# Patient Record
Sex: Female | Born: 1935 | Race: White | Hispanic: No | Marital: Married | Smoking: Never smoker
Health system: Southern US, Community
[De-identification: ages and names within clinical notes are randomized; demographics above are authoritative.]

## PROBLEM LIST (undated history)

## (undated) DIAGNOSIS — E079 Disorder of thyroid, unspecified: Secondary | ICD-10-CM

## (undated) DIAGNOSIS — M1990 Unspecified osteoarthritis, unspecified site: Secondary | ICD-10-CM

## (undated) HISTORY — DX: Disorder of thyroid, unspecified: E07.9

## (undated) HISTORY — DX: Unspecified osteoarthritis, unspecified site: M19.90

---

## 2009-09-26 ENCOUNTER — Inpatient Hospital Stay (HOSPITAL_COMMUNITY): Admission: RE | Admit: 2009-09-26 | Discharge: 2009-09-28 | Payer: Self-pay | Admitting: Orthopedic Surgery

## 2010-11-20 LAB — COMPREHENSIVE METABOLIC PANEL
AST: 23 U/L (ref 0–37)
Albumin: 4.4 g/dL (ref 3.5–5.2)
Calcium: 10.5 mg/dL (ref 8.4–10.5)
Chloride: 102 mEq/L (ref 96–112)
Creatinine, Ser: 1.22 mg/dL — ABNORMAL HIGH (ref 0.4–1.2)
Sodium: 138 mEq/L (ref 135–145)
Total Bilirubin: 1.2 mg/dL (ref 0.3–1.2)
Total Protein: 7.3 g/dL (ref 6.0–8.3)

## 2010-11-20 LAB — BASIC METABOLIC PANEL
BUN: 13 mg/dL (ref 6–23)
CO2: 26 mEq/L (ref 19–32)
Calcium: 8.4 mg/dL (ref 8.4–10.5)
Calcium: 8.6 mg/dL (ref 8.4–10.5)
Creatinine, Ser: 0.84 mg/dL (ref 0.4–1.2)
Creatinine, Ser: 0.94 mg/dL (ref 0.4–1.2)
GFR calc Af Amer: >60 mL/min (ref >60)
GFR calc non Af Amer: 58 mL/min — ABNORMAL LOW (ref >60)

## 2010-11-20 LAB — CROSSMATCH
ABO/RH(D): O POS
Antibody Screen: NEGATIVE

## 2010-11-20 LAB — CBC
Hemoglobin: 10.5 g/dL — ABNORMAL LOW (ref 12.0–15.0)
Hemoglobin: 8.9 g/dL — ABNORMAL LOW (ref 12.0–15.0)
MCHC: 34 g/dL (ref 30.0–36.0)
MCHC: 34.4 g/dL (ref 30.0–36.0)
MCV: 96.8 fL (ref 78.0–100.0)
Platelets: 260 10*3/uL (ref 150–400)
RBC: 2.7 MIL/uL — ABNORMAL LOW (ref 3.87–5.11)
RBC: 3.18 MIL/uL — ABNORMAL LOW (ref 3.87–5.11)
WBC: 6.3 10*3/uL (ref 4.0–10.5)

## 2010-11-20 LAB — DIFFERENTIAL
Basophils Relative: 0 % (ref 0–1)
Monocytes Relative: 10 % (ref 3–12)
Neutro Abs: 3.2 10*3/uL (ref 1.7–7.7)
Neutrophils Relative %: 50 % (ref 43–77)

## 2010-11-20 LAB — URINALYSIS, ROUTINE W REFLEX MICROSCOPIC
Bilirubin Urine: NEGATIVE
Nitrite: NEGATIVE
Specific Gravity, Urine: 1.009 (ref 1.005–1.030)
Urobilinogen, UA: 0.2 mg/dL (ref 0.0–1.0)
pH: 6.5 (ref 5.0–8.0)

## 2010-11-20 LAB — URINE CULTURE

## 2010-11-20 LAB — PROTIME-INR: Prothrombin Time: 12.8 seconds (ref 11.6–15.2)

## 2014-01-13 ENCOUNTER — Ambulatory Visit (INDEPENDENT_AMBULATORY_CARE_PROVIDER_SITE_OTHER): Payer: Medicare HMO

## 2014-01-13 VITALS — BP 112/87 | HR 78 | Resp 18

## 2014-01-13 DIAGNOSIS — M2040 Other hammer toe(s) (acquired), unspecified foot: Secondary | ICD-10-CM

## 2014-01-13 DIAGNOSIS — Q828 Other specified congenital malformations of skin: Secondary | ICD-10-CM

## 2014-01-13 DIAGNOSIS — M79609 Pain in unspecified limb: Secondary | ICD-10-CM

## 2014-01-13 DIAGNOSIS — B351 Tinea unguium: Secondary | ICD-10-CM

## 2014-01-13 DIAGNOSIS — M2010 Hallux valgus (acquired), unspecified foot: Secondary | ICD-10-CM

## 2014-01-13 NOTE — Patient Instructions (Signed)

## 2014-01-13 NOTE — Progress Notes (Signed)
   Subjective:    Patient ID: Tammy Little, female    DOB: [birth year], 80 y.o.   MRN: [id number]  HPI I need my toenails trimmed up and check my feet and my ankles do hurt some and my feet do get cold    Review of Systems  Musculoskeletal:       Joint pain  Hematological: Bruises/bleeds easily.  All other systems reviewed and are negative.      Objective:   Physical Exam 80 year old female well-developed well-nourished oriented x3 vital signs stable. Patient presents at this time for debridement of nails which are somewhat tender yellow discolored has been seen in the past there's been some time. Morphine objective findings as follows patient does have pedal pulses are palpable DP +2/4 bilateral PT plus one over 4 capillary refill timed 3-4 seconds all digits there is mild varicosities both lower extremities and ankles with +2 edema both lower legs and ankles patient does have some degenerative changes with notable bunion deformity left more so than right hammertoe deformities 2 through 5 left 4 and 5 right with adductovarus rotation is 4 and 5 right patient does have dorsal displacement second toe left and more significant bunion deformity on left foot. There is some reduced range of motion and stiffness the joints which are semirigid in nature. Orthopedic biomechanical exam otherwise no x-rays taken rectus rear foot mid foot is noted otherwise normal digital contractures bunions bilateral dermatologically skin color pigment normal hair growth absent nails yellowed and brittle crumbly friable discolored 1 through 5 bilateral tender both on palpation and with enclosed shoe wear. There is also pinch callus in the hallux MTP joints bilateral and IP joint left as well. There is no open wounds or ulcers or signs of infection noted at this time to       Assessment & Plan:  Assessment patient does have mild venous insufficiency with the edema no open wounds or ulcers pedal pulses palpable nails  thick brittle yellowed crumbly friable painful and tender with enclosed shoe wear and ambulation and on palpation mycotic nails noted one through 5 bilateral these are debrided patient is advised topical antifungal shoe to she's using over-the-counter topicals although has not been consistent and there use of by she sees us twice daily for 12 months duration. At this time also suggested followup in 3 months for continued palliative care multiple keratoses sub-1 bilateral debrided and then the hallux IP joint also debridement at this time followup as needed in the future for palliative care also recommended accommodative shoes at all times next  Alvan Dameichard Jamaree Hosier DPM

## 2018-06-30 ENCOUNTER — Ambulatory Visit: Payer: Medicare Other | Admitting: Podiatry

## 2018-06-30 ENCOUNTER — Encounter: Payer: Self-pay | Admitting: Podiatry

## 2018-06-30 VITALS — BP 164/87 | HR 54 | Wt 180.0 lb

## 2018-06-30 DIAGNOSIS — M79609 Pain in unspecified limb: Principal | ICD-10-CM

## 2018-06-30 DIAGNOSIS — B351 Tinea unguium: Secondary | ICD-10-CM

## 2018-06-30 DIAGNOSIS — M79676 Pain in unspecified toe(s): Secondary | ICD-10-CM | POA: Diagnosis not present

## 2018-06-30 NOTE — Progress Notes (Signed)
  Subjective:  Patient ID: Tammy Little, female    DOB: [birth year],  MRN: [id number]  Chief Complaint  Patient presents with  . Nail Problem    Long thick uncomfortable nail that I can't trim     84 y.o. female presents with the above complaint.  Reports painfully elongated nails to both feet.  Review of Systems: Negative except as noted in the HPI. Denies N/V/F/Ch.  Past Medical History:  Diagnosis Date  . Arthritis   . Thyroid disease    No current outpatient medications on file.  Social History   Tobacco Use  Smoking Status Never Smoker  Smokeless Tobacco Never Used    No Known Allergies Objective:   Vitals:   06/30/18 1107  BP: (!) 164/87  Pulse: (!) 54   There is no height or weight on file to calculate BMI. Constitutional Well developed. Well nourished.  Vascular Dorsalis pedis pulses palpable bilaterally. Posterior tibial pulses palpable bilaterally. Capillary refill normal to all digits.  No cyanosis or clubbing noted. Pedal hair growth normal.  Neurologic Normal speech. Oriented to person, place, and time. Epicritic sensation to light touch grossly present bilaterally.  Dermatologic Nails elongated dystrophic pain to palpation No open wounds. No skin lesions.  Orthopedic: Normal joint ROM without pain or crepitus bilaterally. No visible deformities. No bony tenderness.   Radiographs: None Assessment:   1. Pain due to onychomycosis of nail    Plan:  Patient was evaluated and treated and all questions answered.  Onychomycosis with pain -Nails palliatively debridement as below -Educated on self-care -Does not meet criteria for routine debridement.  Procedure: Nail Debridement Rationale: Pain Type of Debridement: manual, sharp debridement. Instrumentation: Nail nipper, rotary burr. Number of Nails: 10  No follow-ups on file.

## 2018-11-08 ENCOUNTER — Emergency Department (HOSPITAL_COMMUNITY): Payer: Medicare HMO

## 2018-11-08 ENCOUNTER — Emergency Department (HOSPITAL_COMMUNITY)
Admission: EM | Admit: 2018-11-08 | Discharge: 2018-11-08 | Disposition: A | Discharge status: Home / Self Care | Payer: Medicare HMO | Attending: Emergency Medicine | Admitting: Emergency Medicine

## 2018-11-08 ENCOUNTER — Other Ambulatory Visit: Payer: Self-pay

## 2018-11-08 DIAGNOSIS — R51 Headache: Secondary | ICD-10-CM | POA: Diagnosis not present

## 2018-11-08 DIAGNOSIS — R1010 Upper abdominal pain, unspecified: Secondary | ICD-10-CM | POA: Insufficient documentation

## 2018-11-08 DIAGNOSIS — Z79899 Other long term (current) drug therapy: Secondary | ICD-10-CM | POA: Diagnosis not present

## 2018-11-08 DIAGNOSIS — R519 Headache, unspecified: Secondary | ICD-10-CM

## 2018-11-08 DIAGNOSIS — Z7901 Long term (current) use of anticoagulants: Secondary | ICD-10-CM | POA: Diagnosis not present

## 2018-11-08 DIAGNOSIS — R109 Unspecified abdominal pain: Secondary | ICD-10-CM

## 2018-11-08 DIAGNOSIS — I4891 Unspecified atrial fibrillation: Secondary | ICD-10-CM

## 2018-11-08 LAB — COMPREHENSIVE METABOLIC PANEL
ALK PHOS: 127 U/L — AB (ref 38–126)
ALT: 19 U/L (ref 0–44)
AST: 16 U/L (ref 15–41)
Albumin: 2.9 g/dL — ABNORMAL LOW (ref 3.5–5.0)
Anion gap: 12 (ref 5–15)
BILIRUBIN TOTAL: 0.8 mg/dL (ref 0.3–1.2)
BUN: 11 mg/dL (ref 8–23)
CALCIUM: 9 mg/dL (ref 8.9–10.3)
CO2: 26 mmol/L (ref 22–32)
CREATININE: 0.89 mg/dL (ref 0.44–1.00)
Chloride: 92 mmol/L — ABNORMAL LOW (ref 98–111)
GFR calc Af Amer: >60 mL/min (ref >60)
Glucose, Bld: 132 mg/dL — ABNORMAL HIGH (ref 70–99)
Potassium: 3.6 mmol/L (ref 3.5–5.1)
Sodium: 130 mmol/L — ABNORMAL LOW (ref 135–145)
TOTAL PROTEIN: 6.9 g/dL (ref 6.5–8.1)

## 2018-11-08 LAB — CBC WITH DIFFERENTIAL/PLATELET
ABS IMMATURE GRANULOCYTES: 0.14 10*3/uL — AB (ref 0.00–0.07)
Basophils Absolute: 0.1 10*3/uL (ref 0.0–0.1)
Basophils Relative: 0 %
EOS ABS: 0.1 10*3/uL (ref 0.0–0.5)
EOS PCT: 1 %
HCT: 37.4 % (ref 36.0–46.0)
Hemoglobin: 12.2 g/dL (ref 12.0–15.0)
Immature Granulocytes: 1 %
Lymphocytes Relative: 7 %
Lymphs Abs: 0.9 10*3/uL (ref 0.7–4.0)
MCH: 29.3 pg (ref 26.0–34.0)
MCHC: 32.6 g/dL (ref 30.0–36.0)
MCV: 89.7 fL (ref 80.0–100.0)
MONO ABS: 1.1 10*3/uL — AB (ref 0.1–1.0)
MONOS PCT: 8 %
NEUTROS ABS: 11.5 10*3/uL — AB (ref 1.7–7.7)
Neutrophils Relative %: 83 %
Platelets: 507 10*3/uL — ABNORMAL HIGH (ref 150–400)
RBC: 4.17 MIL/uL (ref 3.87–5.11)
RDW: 12.6 % (ref 11.5–15.5)
WBC: 13.8 10*3/uL — ABNORMAL HIGH (ref 4.0–10.5)
nRBC: 0 % (ref 0.0–0.2)

## 2018-11-08 LAB — URINALYSIS, ROUTINE W REFLEX MICROSCOPIC
BILIRUBIN URINE: NEGATIVE
Bacteria, UA: NONE SEEN
Glucose, UA: NEGATIVE mg/dL
KETONES UR: 5 mg/dL — AB
Nitrite: NEGATIVE
PH: 7 (ref 5.0–8.0)
Protein, ur: NEGATIVE mg/dL
Specific Gravity, Urine: 1.006 (ref 1.005–1.030)

## 2018-11-08 LAB — PROTIME-INR
INR: 1.1 (ref 0.8–1.2)
Prothrombin Time: 14.4 seconds (ref 11.4–15.2)

## 2018-11-08 LAB — TROPONIN I: Troponin I: <0.03 ng/mL (ref <0.03)

## 2018-11-08 LAB — LIPASE, BLOOD: LIPASE: 35 U/L (ref 11–51)

## 2018-11-08 MED ORDER — OXYCODONE HCL 10 MG PO TABS
10.00 | ORAL_TABLET | ORAL | Status: DC
Start: ? — End: 2018-11-08

## 2018-11-08 MED ORDER — GABAPENTIN 100 MG PO CAPS
100.00 | ORAL_CAPSULE | ORAL | Status: DC
Start: 2018-11-06 — End: 2018-11-08

## 2018-11-08 MED ORDER — AMLODIPINE BESYLATE 5 MG PO TABS
5.00 | ORAL_TABLET | ORAL | Status: DC
Start: 2018-11-07 — End: 2018-11-08

## 2018-11-08 MED ORDER — PANTOPRAZOLE SODIUM 20 MG PO TBEC: 20.0000 mg | DELAYED_RELEASE_TABLET | Freq: Every day | ORAL | 0 refills | Status: DC

## 2018-11-08 MED ORDER — GENERIC EXTERNAL MEDICATION
12.50 | Status: DC
Start: ? — End: 2018-11-08

## 2018-11-08 MED ORDER — ONDANSETRON HCL 4 MG PO TABS: 4.0000 mg | ORAL_TABLET | Freq: Two times a day (BID) | ORAL | 0 refills | Status: DC | PRN

## 2018-11-08 MED ORDER — SODIUM CHLORIDE 0.9 % IV BOLUS
500.0000 mL | Freq: Once | INTRAVENOUS | Status: AC
  Administered 2018-11-08: 500 mL via INTRAVENOUS

## 2018-11-08 MED ORDER — ONDANSETRON 4 MG PO TBDP
4.00 | ORAL_TABLET | ORAL | Status: DC
Start: ? — End: 2018-11-08

## 2018-11-08 MED ORDER — LIDOCAINE 5 % EX PTCH
1.00 | MEDICATED_PATCH | CUTANEOUS | Status: DC
Start: 2018-11-07 — End: 2018-11-08

## 2018-11-08 MED ORDER — MORPHINE SULFATE (PF) 4 MG/ML IV SOLN
4.0000 mg | Freq: Once | INTRAVENOUS | Status: AC
  Administered 2018-11-08: 4 mg via INTRAVENOUS
  Filled 2018-11-08: qty 1

## 2018-11-08 MED ORDER — DILTIAZEM HCL 25 MG/5ML IV SOLN
10.0000 mg | Freq: Once | INTRAVENOUS | Status: AC
  Administered 2018-11-08: 10 mg via INTRAVENOUS
  Filled 2018-11-08: qty 5

## 2018-11-08 MED ORDER — LISINOPRIL 40 MG PO TABS
40.00 | ORAL_TABLET | ORAL | Status: DC
Start: 2018-11-07 — End: 2018-11-08

## 2018-11-08 MED ORDER — ONDANSETRON HCL 4 MG/2ML IJ SOLN
4.0000 mg | Freq: Once | INTRAMUSCULAR | Status: AC
  Administered 2018-11-08: 4 mg via INTRAVENOUS
  Filled 2018-11-08: qty 2

## 2018-11-08 MED ORDER — PRAVASTATIN SODIUM 40 MG PO TABS
40.00 | ORAL_TABLET | ORAL | Status: DC
Start: 2018-11-07 — End: 2018-11-08

## 2018-11-08 MED ORDER — APIXABAN 5 MG PO TABS
5.00 | ORAL_TABLET | ORAL | Status: DC
Start: 2018-11-06 — End: 2018-11-08

## 2018-11-08 MED ORDER — SENNOSIDES 8.6 MG PO TABS
2.00 | ORAL_TABLET | ORAL | Status: DC
Start: ? — End: 2018-11-08

## 2018-11-08 MED ORDER — METOPROLOL SUCCINATE ER 50 MG PO TB24
50.00 | ORAL_TABLET | ORAL | Status: DC
Start: 2018-11-07 — End: 2018-11-08

## 2018-11-08 MED ORDER — ACETAMINOPHEN 325 MG PO TABS
650.00 | ORAL_TABLET | ORAL | Status: DC
Start: ? — End: 2018-11-08

## 2018-11-08 MED ORDER — SODIUM CHLORIDE 0.9 % IV SOLN
INTRAVENOUS | Status: DC
  Administered 2018-11-08: 14:00:00 via INTRAVENOUS

## 2018-11-08 MED ORDER — POLYETHYLENE GLYCOL 3350 17 G PO PACK
17.00 | PACK | ORAL | Status: DC
Start: ? — End: 2018-11-08

## 2018-11-08 NOTE — ED Notes (Signed)
Pt wants to try to use the bed pan first before we perform the in and out

## 2018-11-08 NOTE — Discharge Instructions (Signed)
Follow-up with your primary care doctor, consider seeing a GI doctor for further evaluation

## 2018-11-08 NOTE — ED Triage Notes (Signed)
Per Pt,  She comes from home complaining of a headache x2 weeks.  [facility] and states she didn't find was was going on even though she was there a week.  Pt AOx4 NAD noted at this time.

## 2018-11-08 NOTE — ED Notes (Signed)
EDP IN WITH PT.

## 2018-11-08 NOTE — ED Provider Notes (Signed)
[facility] EMERGENCY DEPARTMENT Provider Note   CSN: [id number] Arrival date & time: 11/08/18  1051    History   Chief Complaint Chief Complaint  Patient presents with  . Abdominal Pain  . Headache    HPI Tammy Little is a 85 y.o. female.     HPI Presents to the emergency room for evaluation of several symptoms but primarily headache as well as abdominal pain.  Patient states she started feeling sick couple of weeks ago.  She saw her primary care doctor and was diagnosed with possible urinary tract infection.  This was back the end of February.  Patient symptoms started getting worse so she was seen at a hospital in [place].  Family member states she was admitted to the hospital.  Was having trouble with abdominal pain vomiting as well as headaches.  Was just recently discharged.  Patient returns to the ED today because she continues to have the same symptoms and she started vomiting.  She continues to have a headache.  She has upper abdominal discomfort and has had nausea vomiting.  No known fevers.  No known focal weakness or numbness.  Patient does not have history of prior headaches until this spell. Past Medical History:  Diagnosis Date  . Arthritis   . Thyroid disease       No past surgical history on file.   OB History   No obstetric history on file.      Home Medications    Prior to Admission medications   Medication Sig Start Date End Date Taking? Authorizing Provider  acetaminophen (TYLENOL) 325 MG tablet Take 325 mg by mouth daily. 11/06/18  Yes [provider]  amoxicillin (AMOXIL) 875 MG tablet Take 875 mg by mouth 2 (two) times daily. 10/28/18  Yes [provider]  ELIQUIS 5 MG TABS tablet Take 5 mg by mouth 2 (two) times daily. 10/22/18  Yes [provider]  folic acid (FOLVITE) 800 MCG tablet Take 800 mcg by mouth daily.   Yes [provider]  gabapentin (NEURONTIN) 100 MG capsule Take 100 mg by  mouth 3 (three) times daily.   Yes [provider]  levothyroxine (SYNTHROID, LEVOTHROID) 25 MCG tablet Take 25 mcg by mouth every morning. 09/18/18  Yes [provider]  lisinopril (PRINIVIL,ZESTRIL) 40 MG tablet Take 30 mg by mouth daily.   Yes [provider]  pravastatin (PRAVACHOL) 40 MG tablet Take 40 mg by mouth daily. 10/04/18  Yes [provider]  ondansetron (ZOFRAN) 4 MG tablet Take 1 tablet (4 mg total) by mouth 2 (two) times daily as needed for nausea or vomiting. 11/08/18   Linwood Dibbles, MD  pantoprazole (PROTONIX) 20 MG tablet Take 1 tablet (20 mg total) by mouth daily. 11/08/18   Linwood Dibbles, MD    Family History No family history on file.  Social History Social History   Tobacco Use  . Smoking status: Never Smoker  . Smokeless tobacco: Never Used  Substance Use Topics  . Alcohol use: No  . Drug use: No     Allergies   Patient has no known allergies.   Review of Systems Review of Systems  All other systems reviewed and are negative.    Physical Exam Updated Vital Signs BP (!) 142/90   Pulse (!) 57   Temp 98 F (36.7 C) (Oral)   Resp 16   Ht 1.626 m ( )   Wt 70.3 kg   SpO2 100%  BMI 26.61 kg/m   Physical Exam Vitals signs and nursing note reviewed.  Constitutional:      Appearance: She is ill-appearing.  HENT:     Head: Normocephalic and atraumatic.     Right Ear: External ear normal.     Left Ear: External ear normal.  Eyes:     General: No scleral icterus.       Right eye: No discharge.        Left eye: No discharge.     Conjunctiva/sclera: Conjunctivae normal.  Neck:     Musculoskeletal: Neck supple.     Trachea: No tracheal deviation.  Cardiovascular:     Rate and Rhythm: Tachycardia present. Rhythm irregularly irregular.  Pulmonary:     Effort: Pulmonary effort is normal. No respiratory distress.     Breath sounds: Normal breath sounds. No stridor. No wheezing or rales.  Abdominal:     General:  Bowel sounds are normal. There is no distension.     Palpations: Abdomen is soft.     Tenderness: There is no abdominal tenderness. There is no guarding or rebound.  Musculoskeletal:        General: No tenderness.  Skin:    General: Skin is warm and dry.     Findings: No rash.  Neurological:     Mental Status: She is alert.     Cranial Nerves: No cranial nerve deficit (no facial droop, extraocular movements intact, no slurred speech).     Sensory: No sensory deficit.     Motor: No abnormal muscle tone or seizure activity.     Coordination: Coordination normal.      ED Treatments / Results  Labs (all labs ordered are listed, but only abnormal results are displayed) Labs Reviewed  COMPREHENSIVE METABOLIC PANEL - Abnormal; Notable for the following components:      Result Value   Sodium 130 (*)    Chloride 92 (*)    Glucose, Bld 132 (*)    Albumin 2.9 (*)    Alkaline Phosphatase 127 (*)    All other components within normal limits  CBC WITH DIFFERENTIAL/PLATELET - Abnormal; Notable for the following components:   WBC 13.8 (*)    Platelets 507 (*)    Neutro Abs 11.5 (*)    Monocytes Absolute 1.1 (*)    Abs Immature Granulocytes 0.14 (*)    All other components within normal limits  URINALYSIS, ROUTINE W REFLEX MICROSCOPIC - Abnormal; Notable for the following components:   Hgb urine dipstick SMALL (*)    Ketones, ur 5 (*)    Leukocytes,Ua TRACE (*)    All other components within normal limits  URINE CULTURE  LIPASE, BLOOD  PROTIME-INR  TROPONIN I    EKG EKG Interpretation  Date/Time:  Saturday November 08 2018 11:05:27 EST Ventricular Rate:  143 PR Interval:    QRS Duration: 133 QT Interval:  342 QTC Calculation: 528 R Axis:   91 Text Interpretation:  probable  Atrial flutter with 2 to 1 block RBBB and LPFB Baseline wander in lead(s) V6 Confirmed by Linwood Dibbles [phone]) on 11/08/2018 11:44:07 AM   Radiology Ct Head Wo Contrast  Result Date: 11/08/2018 CLINICAL  DATA:  Headache EXAM: CT HEAD WITHOUT CONTRAST TECHNIQUE: Contiguous axial images were obtained from the base of the skull through the vertex without intravenous contrast. COMPARISON:  None. FINDINGS: Brain: No evidence of acute infarction, hemorrhage, hydrocephalus, extra-axial collection or mass lesion/mass effect. Periventricular white matter hypodensity. Vascular: No hyperdense vessel or unexpected  calcification. Skull: Normal. Negative for fracture or focal lesion. Sinuses/Orbits: No acute finding. Other: None. IMPRESSION: No acute intracranial pathology. Small-vessel white matter disease. No non-contrast CT findings to explain headache. Electronically Signed   By: Lauralyn Primes M.D.   On: 11/08/2018 13:33   Dg Abd Acute W/chest  Result Date: 11/08/2018 CLINICAL DATA:  Upper mid abdominal pain, nausea and vomiting for the past 2 weeks. EXAM: DG ABDOMEN ACUTE W/ 1V CHEST COMPARISON:  Abdomen and pelvis CT dated 11/21/2015 and chest radiographs dated 09/23/2009. FINDINGS: Decreased depth of inspiration with interval mild enlargement of the cardiac silhouette. The lungs are clear with mild chronic prominence of the interstitial markings. Mild bilateral shoulder degenerative changes and mild dextroconvex thoracic scoliosis. Paucity of intestinal gas with no free peritoneal air. Marked lumbar spine degenerative changes and mild scoliosis. IMPRESSION: 1. No acute abnormality. 2. Mild cardiomegaly and mild chronic interstitial lung disease. Electronically Signed   By: Beckie Salts M.D.   On: 11/08/2018 12:28    Procedures Procedures (including critical care time)  Medications Ordered in ED Medications  sodium chloride 0.9 % bolus 500 mL (0 mLs Intravenous Stopped 11/08/18 1356)    And  0.9 %  sodium chloride infusion ( Intravenous New Bag/Given 11/08/18 1416)  ondansetron (ZOFRAN) injection 4 mg (4 mg Intravenous Given 11/08/18 1136)  morphine 4 MG/ML injection 4 mg (4 mg Intravenous Given 11/08/18 1136)    diltiazem (CARDIZEM) injection 10 mg (10 mg Intravenous Given 11/08/18 1134)     Initial Impression / Assessment and Plan / ED Course  I have reviewed the triage vital signs and the nursing notes.  Pertinent labs & imaging results that were available during my care of the patient were reviewed by me and considered in my medical decision making (see chart for details).  Prior records were reviewed.  Discharge summary from [facility] is available in care everywhere.  Patient was admitted on February 27 and discharged on March 5.  Patient was treated for hyponatremia.  Discharge sodium was at 127.  Patient was also evaluated for abdominal pain and vomiting.  Her flu test was negative.  She had a CT abdomen pelvis that showed evidence of enteritis.  She also complained of headache and neck pain.  Patient had a CT of her head and a CT of her C-spine without acute findings.  She was discharged with Percocet gabapentin and Lidoderm patch.  Patient was noted to be in atrial flutter and she was continued on metoprolol and Eliquis.  Patient also had her levothyroxine held because her TSH was 0.08 Clinical Course as of Nov 07 1557  Sat Nov 08, 2018  1555 Laboratory tests are reassuring.  Mild decrease in sodium at 130.  Urinalysis not definitive UTI.  Patient does have a persistent leukocytosis but this is not significantly changed from recent.   [JK]  1557 CT and plain films without acute findings. Patient heart rate has improved with medications   [JK]    Clinical Course User Index [JK] Linwood Dibbles, MD    Patient presented to the emergency room for evaluation of persistent issues with abdominal pain as well as headache.  Patient had an extensive work-up during her hospitalization.  She was just discharged 2 days ago.  Her findings today do not show any significant abnormalities that would warrant rehospitalization.  Unclear what specifically causing some this abdominal pain for her but she did not have  any vomiting in the ED and is tolerating p.o. fluids  and food.  I think the patient can continue on antiemetics.  She Artie has pain medications.  I will have her try taking an antacid.  I recommend outpatient GI follow-up.  She does have known atrial fibrillation and her rate is controlled at this time.  Final Clinical Impressions(s) / ED Diagnoses   Final diagnoses:  Intractable headache, unspecified chronicity pattern, unspecified headache type  Abdominal pain, unspecified abdominal location  Atrial fibrillation, unspecified type [facility])    ED Discharge Orders         Ordered    pantoprazole (PROTONIX) 20 MG tablet  Daily     11/08/18 1549    ondansetron (ZOFRAN) 4 MG tablet  2 times daily PRN     11/08/18 1549           Linwood Dibbles, MD 11/08/18 1559

## 2018-11-10 LAB — URINE CULTURE: Culture: NO GROWTH

## 2019-10-20 IMAGING — CT CT HEAD W/O CM
4 series · 16 of 47 positions shown, 18 images · non-contrast
Comparison: None.

CLINICAL DATA: Headache

EXAM:
CT HEAD WITHOUT CONTRAST
TECHNIQUE: Contiguous axial images were obtained from the base of the skull
through the vertex without intravenous contrast.

[Series 3: head wo · axial · 0.40mm/px · z∈[-158,-52]mm · 7 of 29 slices shown, 9 images]
[im 4/29  brain]
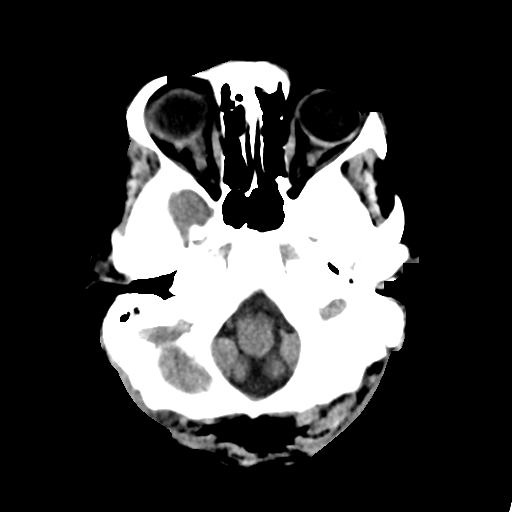
[im 4/29  bone]
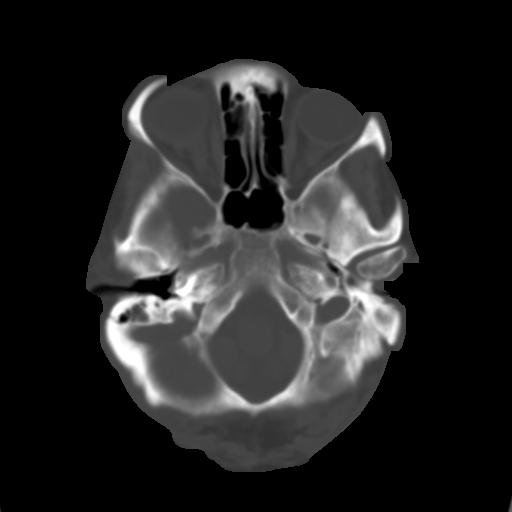
[im 8/29  brain]
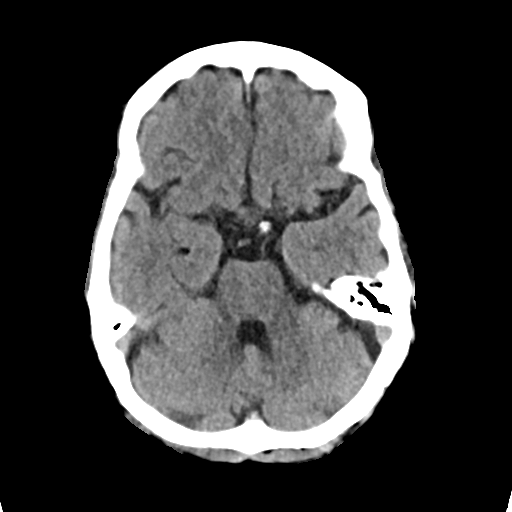
[im 11/29  brain]
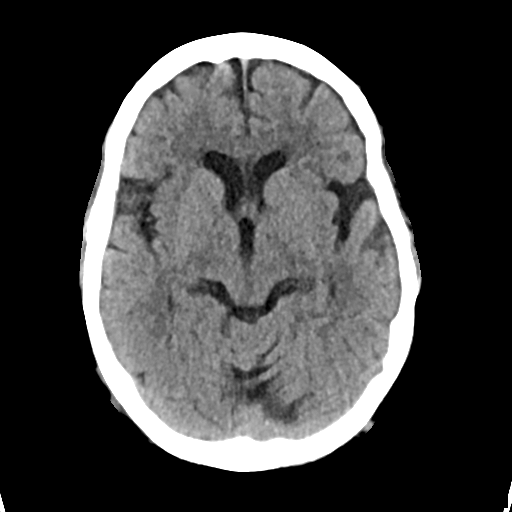
[im 15/29  brain]
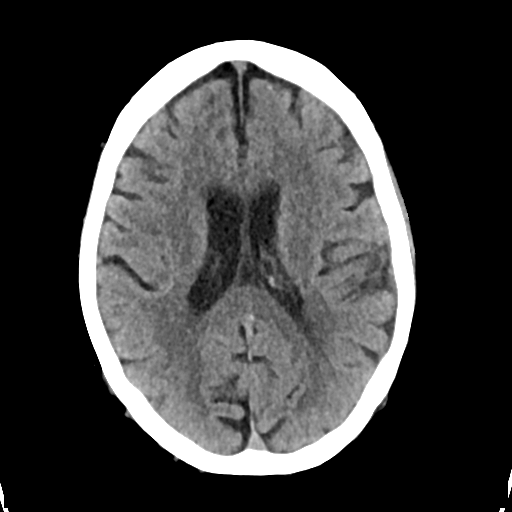
[im 18/29  brain]
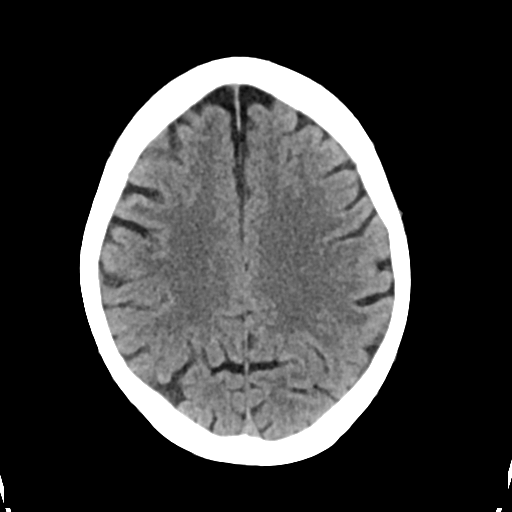
[im 18/29  bone]
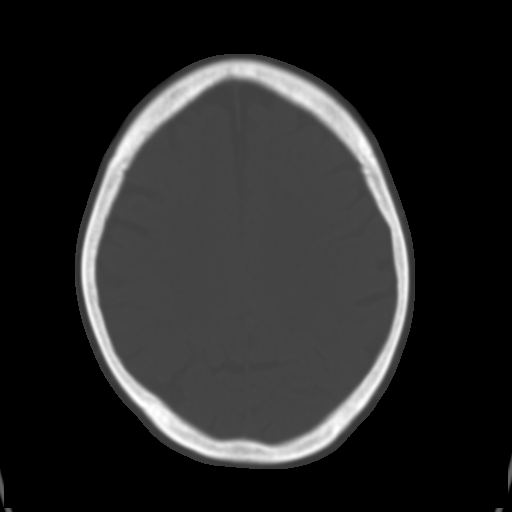
[im 22/29  brain]
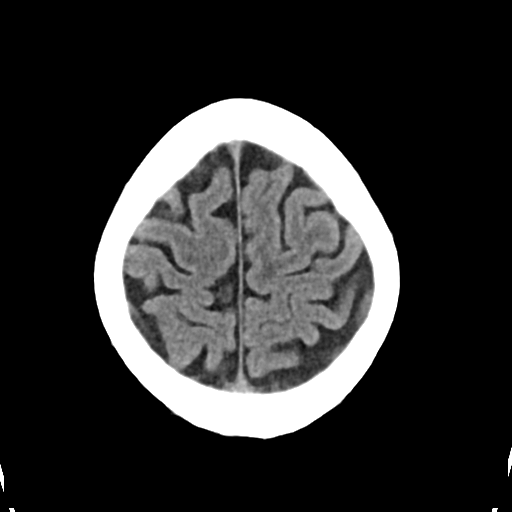
[im 25/29  brain]
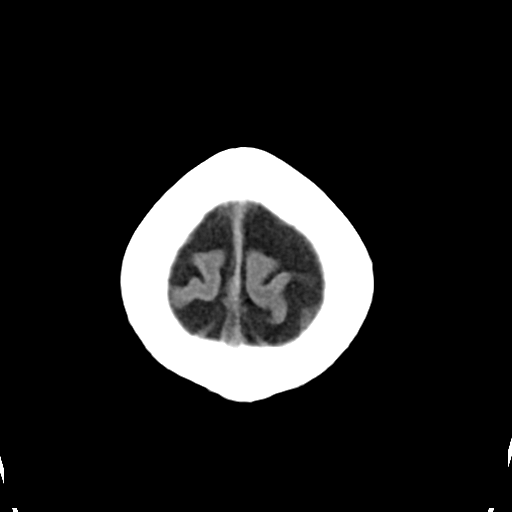

[Series 4: head bone · axial · 0.40mm/px · z∈[-158,-130]mm · 3 of 73 slices shown]
[im 8/73  bone]
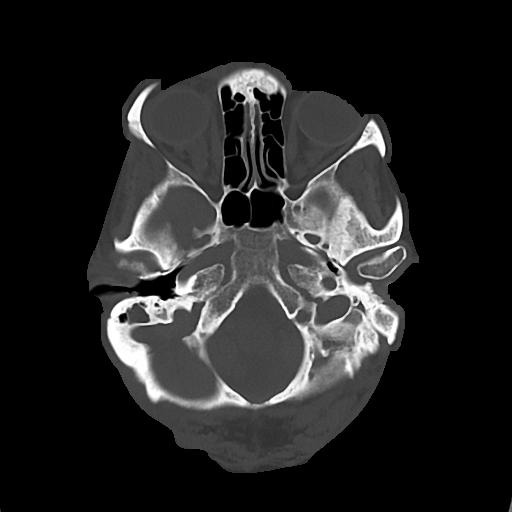
[im 15/73  bone]
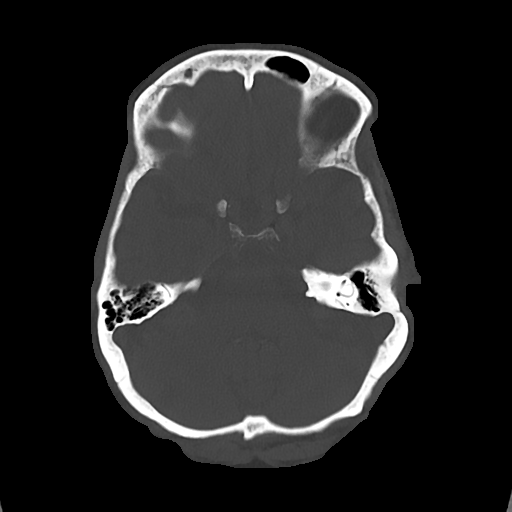
[im 22/73  bone]
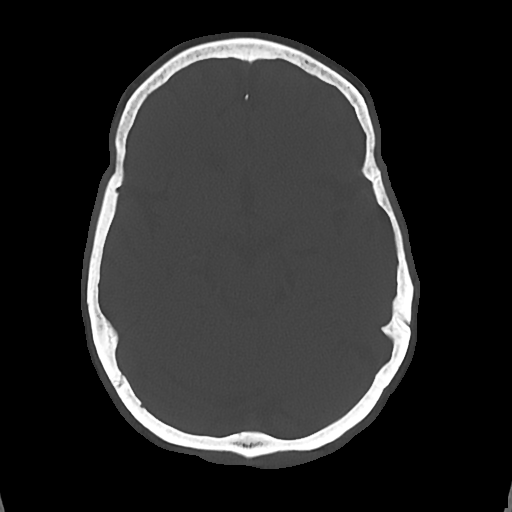

[Series 5: cor soft · coronal · 0.33mm/px · 3 of 60 slices shown]
[im 20/60  brain]
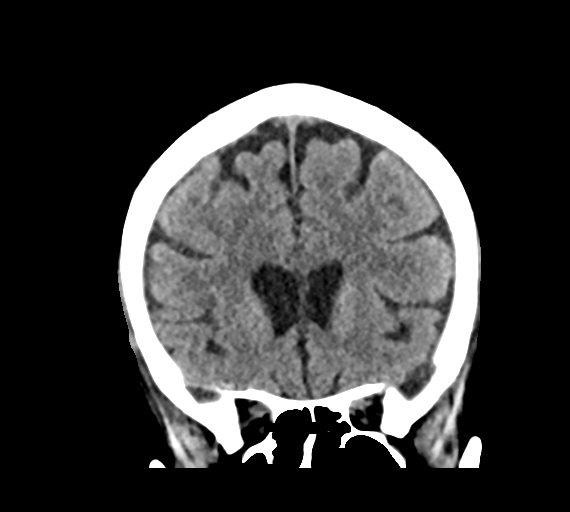
[im 27/60  brain]
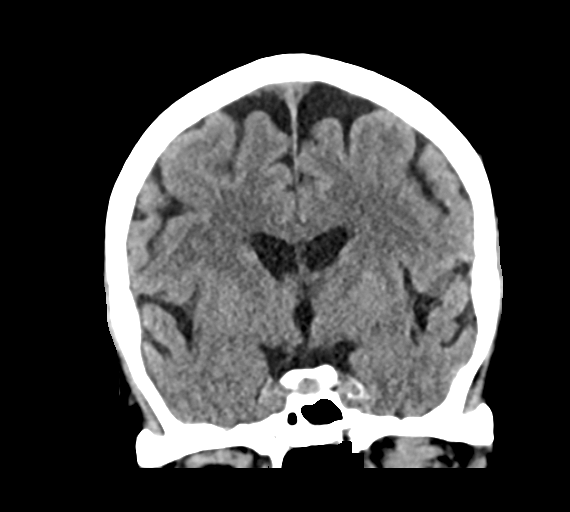
[im 33/60  brain]
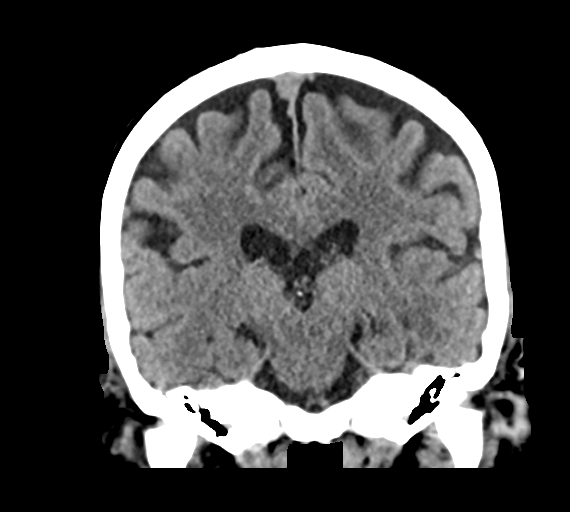

[Series 6: sag soft · sagittal · 0.31mm/px · 3 of 48 slices shown]
[im 16/48  brain]
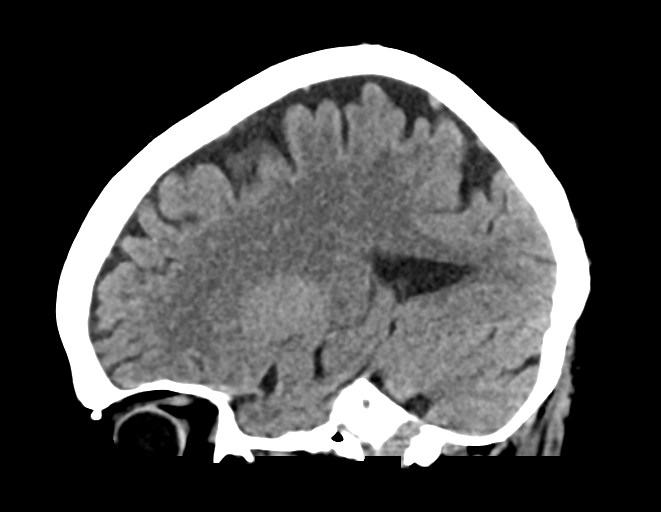
[im 24/48  brain]
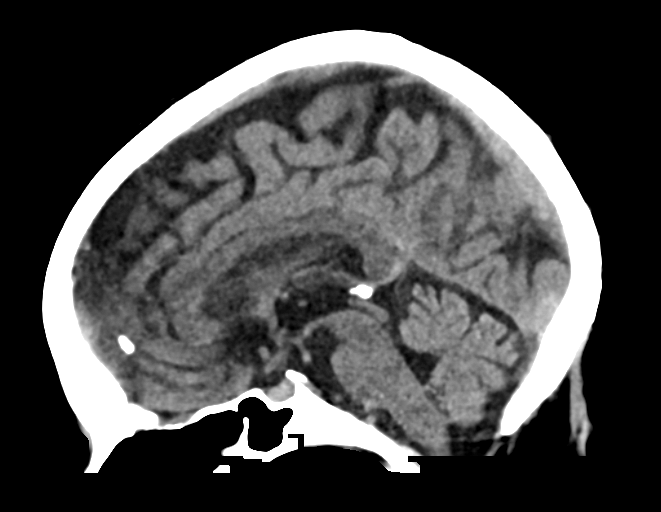
[im 32/48  brain]
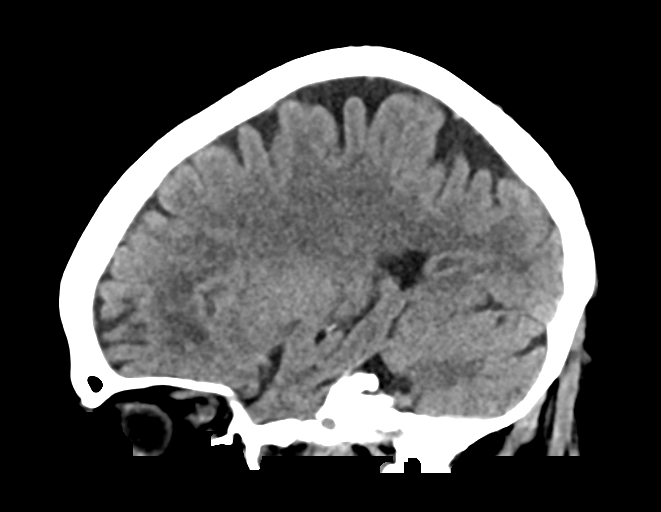

[16 of 47 positions shown; findings below may reference images not displayed]

FINDINGS: Brain: No evidence of acute infarction, hemorrhage, hydrocephalus,
extra-axial collection or mass lesion/mass effect. Periventricular
white matter hypodensity.

Vascular: No hyperdense vessel or unexpected calcification.

Skull: Normal. Negative for fracture or focal lesion.

Sinuses/Orbits: No acute finding.

Other: None.
IMPRESSION: No acute intracranial pathology. Small-vessel white matter disease.
No non-contrast CT findings to explain headache.

## 2019-10-20 IMAGING — DX DG ABDOMEN ACUTE W/ 1V CHEST
3 series · 3 of 3 positions shown · non-contrast
Comparison: Abdomen and pelvis CT dated 11/21/2015 and chest
radiographs dated 09/23/2009.

CLINICAL DATA: Upper mid abdominal pain, nausea and vomiting for
the past 2 weeks.

EXAM:
DG ABDOMEN ACUTE W/ 1V CHEST

[abdomen erect]
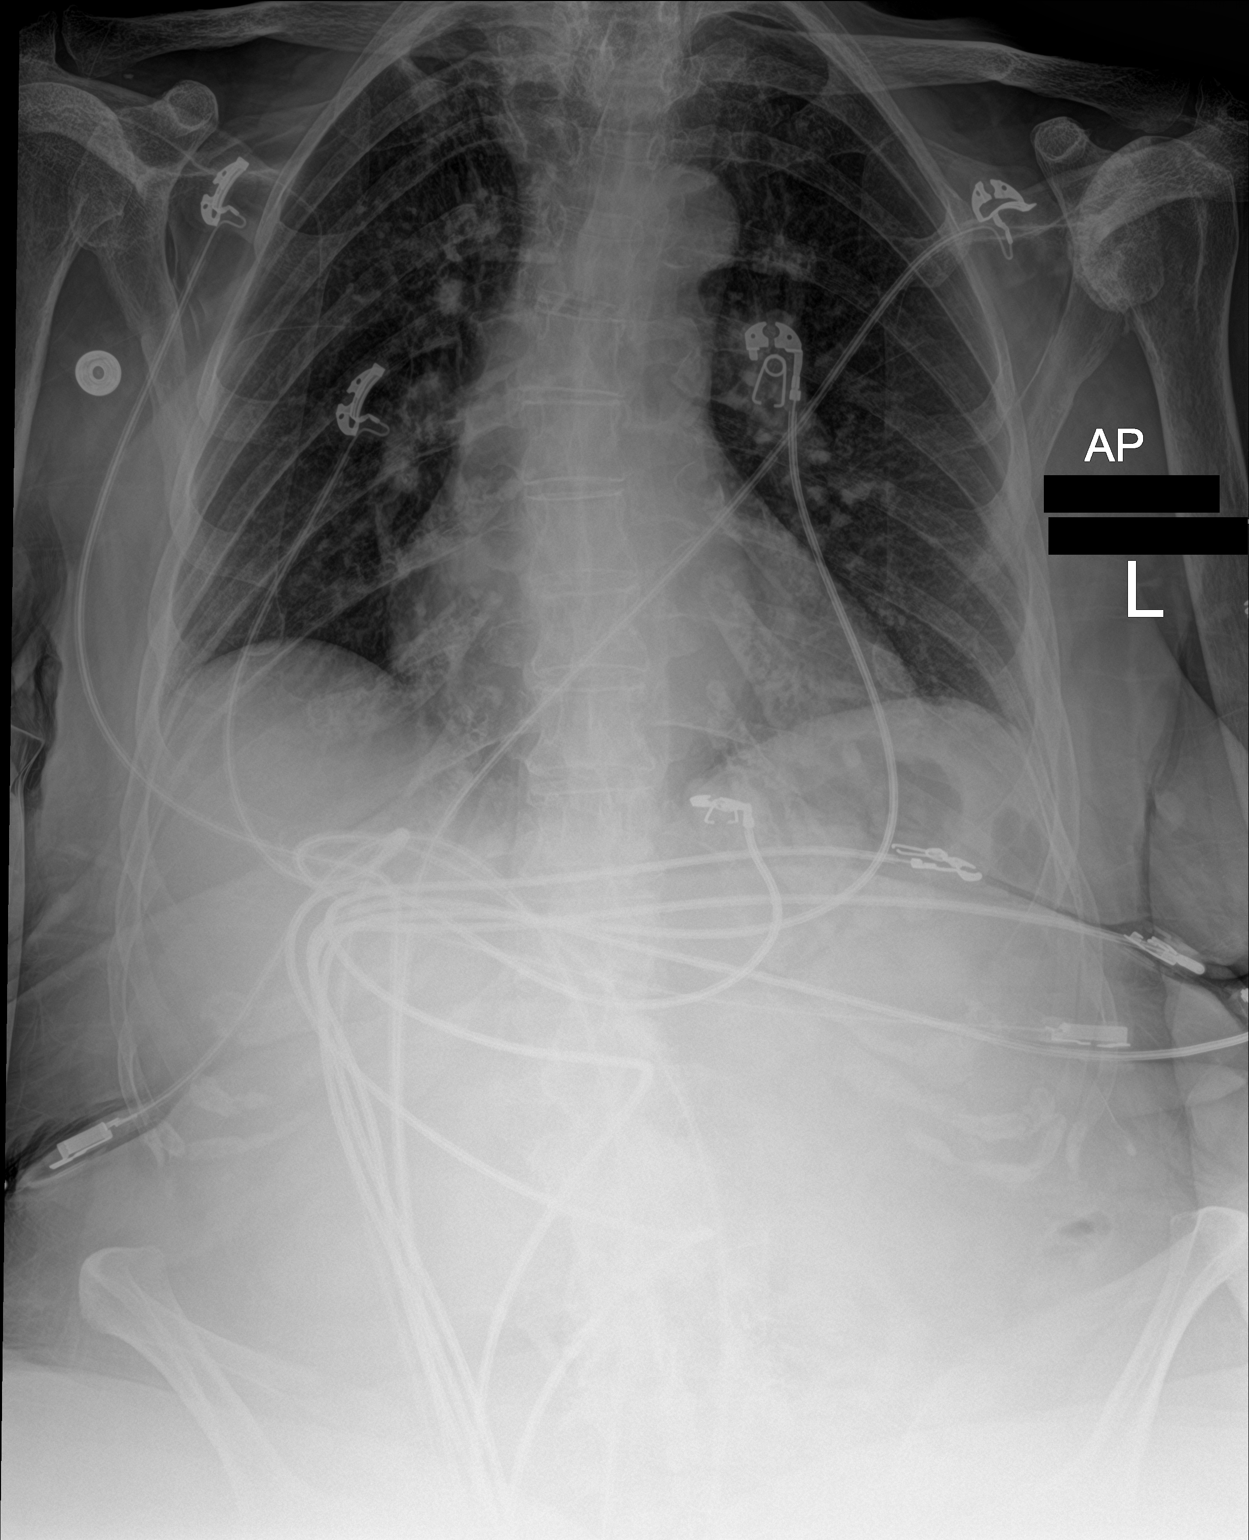

[abdomen supine]
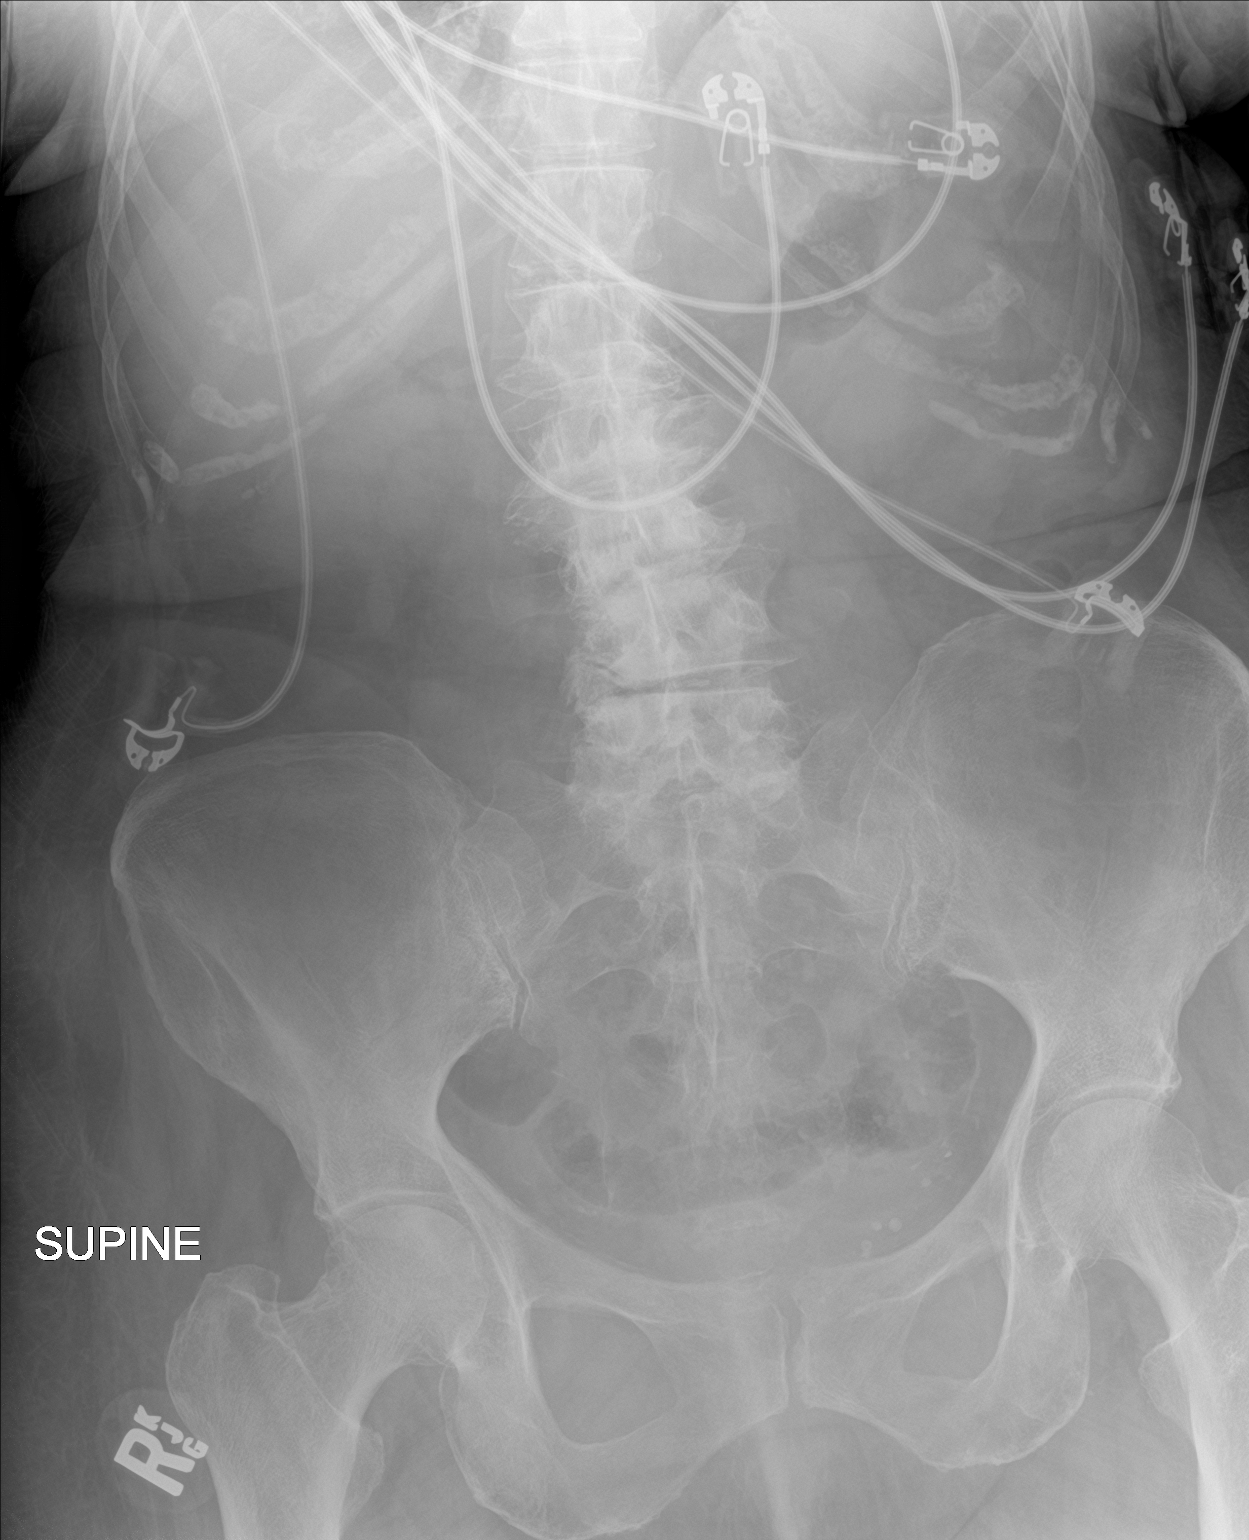

[chest ap]
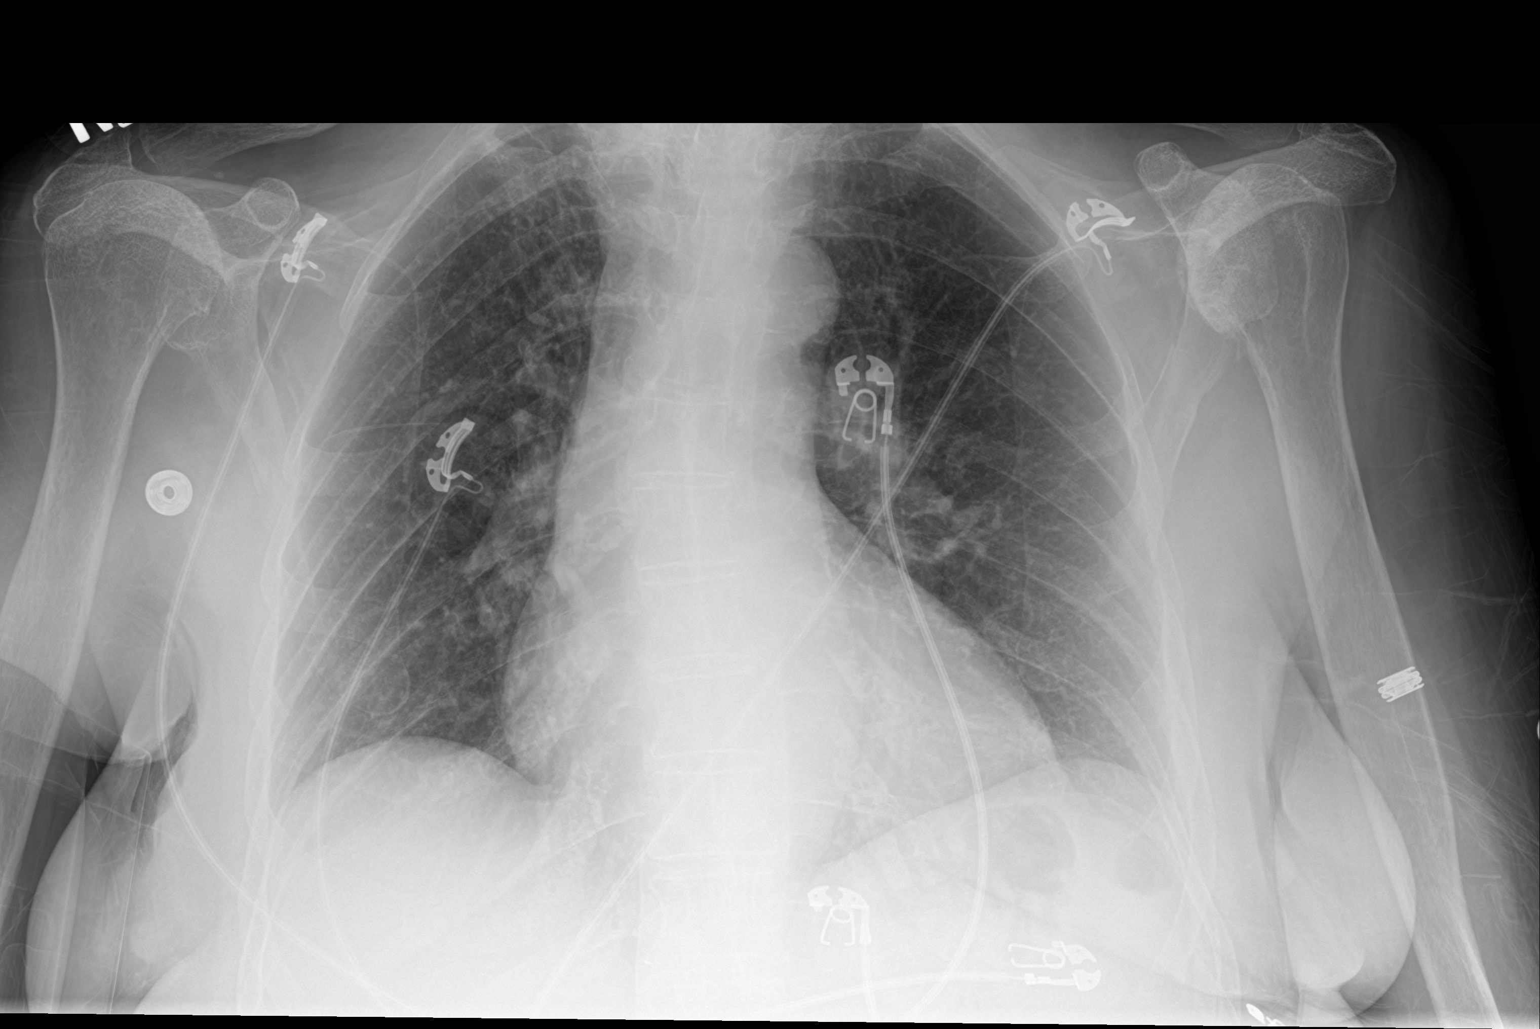

[3 of 3 positions shown; findings below may reference images not displayed]

FINDINGS: Decreased depth of inspiration with interval mild enlargement of the
cardiac silhouette. The lungs are clear with mild chronic prominence
of the interstitial markings. Mild bilateral shoulder degenerative
changes and mild dextroconvex thoracic scoliosis.

Paucity of intestinal gas with no free peritoneal air. Marked lumbar
spine degenerative changes and mild scoliosis.
IMPRESSION: 1. No acute abnormality.
2. Mild cardiomegaly and mild chronic interstitial lung disease.

## 2023-05-05 DEATH — deceased
